# Patient Record
Sex: Male | Born: 2000 | Hispanic: No | Marital: Single | Smoking: Never smoker
Health system: Southern US, Community
[De-identification: ages and names within clinical notes are randomized; demographics above are authoritative.]

---
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
## 2020-01-08 ENCOUNTER — Other Ambulatory Visit (HOSPITAL_COMMUNITY): Payer: Self-pay | Admitting: Internal Medicine

## 2020-01-08 MED FILL — predniSONE 5 MG TABS: 5 | 90 days supply | Qty: 90 | Fill #0

## 2020-01-09 MED FILL — LISINOPRIL 10 MG TABS: 10 | 30 days supply | Qty: 30 | Fill #5

## 2020-01-09 MED FILL — SULFAMETHOXAZOLE-TMP SS TAB: 400-80 | 35 days supply | Qty: 15 | Fill #2

## 2020-03-22 MED FILL — SULFAMETHOXAZOLE-TMP SS TAB: 400-80 | 35 days supply | Qty: 15 | Fill #3

## 2020-04-16 ENCOUNTER — Other Ambulatory Visit (HOSPITAL_COMMUNITY): Payer: Self-pay | Admitting: Internal Medicine

## 2020-04-16 MED FILL — predniSONE 5 MG TABS: 5 | 90 days supply | Qty: 180 | Fill #0

## 2020-04-19 ENCOUNTER — Other Ambulatory Visit (HOSPITAL_COMMUNITY): Payer: Self-pay

## 2020-04-22 ENCOUNTER — Other Ambulatory Visit (HOSPITAL_COMMUNITY): Payer: Self-pay

## 2020-04-22 MED FILL — Lisinopril Tab 10 MG: ORAL | 30 days supply | Qty: 30 | Fill #0 | Status: AC

## 2020-05-15 ENCOUNTER — Other Ambulatory Visit (HOSPITAL_COMMUNITY): Payer: Self-pay

## 2020-05-17 ENCOUNTER — Other Ambulatory Visit (HOSPITAL_COMMUNITY): Payer: Self-pay

## 2020-05-17 MED ORDER — MYCOPHENOLATE SODIUM 360 MG PO TBEC
720.0000 mg | DELAYED_RELEASE_TABLET | Freq: Two times a day (BID) | ORAL | 5 refills | Status: AC
  Filled 2020-05-17 – 2020-05-23 (×2): qty 120, 30d supply, fill #0

## 2020-05-22 ENCOUNTER — Other Ambulatory Visit (HOSPITAL_COMMUNITY): Payer: Self-pay

## 2020-05-23 ENCOUNTER — Other Ambulatory Visit (HOSPITAL_COMMUNITY): Payer: Self-pay

## 2020-08-10 ENCOUNTER — Other Ambulatory Visit: Payer: Self-pay

## 2020-08-10 MED FILL — Lisinopril Tab 10 MG: ORAL | 30 days supply | Qty: 30 | Fill #1 | Status: AC

## 2020-08-12 ENCOUNTER — Other Ambulatory Visit (HOSPITAL_COMMUNITY): Payer: Self-pay

## 2020-08-13 ENCOUNTER — Other Ambulatory Visit (HOSPITAL_COMMUNITY): Payer: Self-pay

## 2020-08-14 ENCOUNTER — Other Ambulatory Visit (HOSPITAL_COMMUNITY): Payer: Self-pay

## 2020-08-14 ENCOUNTER — Other Ambulatory Visit: Payer: Self-pay

## 2020-08-15 ENCOUNTER — Other Ambulatory Visit (HOSPITAL_COMMUNITY): Payer: Self-pay

## 2020-10-10 ENCOUNTER — Other Ambulatory Visit: Payer: Self-pay

## 2020-10-11 ENCOUNTER — Other Ambulatory Visit (HOSPITAL_COMMUNITY): Payer: Self-pay

## 2020-10-11 MED FILL — Sulfamethoxazole-Trimethoprim Tab 400-80 MG: ORAL | 28 days supply | Qty: 12 | Fill #0 | Status: AC

## 2020-10-14 ENCOUNTER — Other Ambulatory Visit (HOSPITAL_COMMUNITY): Payer: Self-pay

## 2020-10-14 MED ORDER — SULFAMETHOXAZOLE-TRIMETHOPRIM 400-80 MG PO TABS
1.0000 | ORAL_TABLET | ORAL | 3 refills | Status: AC
  Filled 2020-10-14 – 2021-01-09 (×2): qty 15, 35d supply, fill #0

## 2020-10-23 ENCOUNTER — Other Ambulatory Visit (HOSPITAL_COMMUNITY): Payer: Self-pay

## 2020-10-23 MED ORDER — SULFAMETHOXAZOLE-TRIMETHOPRIM 400-80 MG PO TABS
1.0000 | ORAL_TABLET | ORAL | 1 refills | Status: AC
  Filled 2020-10-23: qty 12, 28d supply, fill #0

## 2020-10-23 MED ORDER — PREDNISONE 5 MG PO TABS
10.0000 mg | ORAL_TABLET | Freq: Every day | ORAL | 0 refills | Status: DC
  Filled 2020-10-23: qty 180, 90d supply, fill #0

## 2020-10-23 MED ORDER — LISINOPRIL 10 MG PO TABS
10.0000 mg | ORAL_TABLET | Freq: Every day | ORAL | 1 refills | Status: AC
  Filled 2020-10-23: qty 30, 30d supply, fill #0
  Filled 2021-01-09: qty 30, 30d supply, fill #1

## 2020-10-23 MED ORDER — VITAMIN D-1000 MAX ST 25 MCG (1000 UT) PO TABS: ORAL_TABLET | ORAL | 1 refills | Status: AC

## 2020-10-30 ENCOUNTER — Other Ambulatory Visit (HOSPITAL_COMMUNITY): Payer: Self-pay

## 2020-10-30 MED ORDER — VITAMIN D-1000 MAX ST 25 MCG (1000 UT) PO TABS: ORAL_TABLET | ORAL | 5 refills | Status: AC

## 2020-11-04 ENCOUNTER — Other Ambulatory Visit (HOSPITAL_COMMUNITY): Payer: Self-pay

## 2020-11-04 MED ORDER — TACROLIMUS 1 MG PO CAPS
8.0000 mg | ORAL_CAPSULE | Freq: Two times a day (BID) | ORAL | 3 refills | Status: AC
  Filled 2020-11-04: qty 1440, 90d supply, fill #0

## 2020-11-08 ENCOUNTER — Other Ambulatory Visit (HOSPITAL_COMMUNITY): Payer: Self-pay

## 2020-11-08 MED ORDER — FLUCONAZOLE 50 MG PO TABS
50.0000 mg | ORAL_TABLET | Freq: Every day | ORAL | 0 refills | Status: AC
  Filled 2020-11-08: qty 30, 30d supply, fill #0

## 2020-11-08 MED ORDER — VALGANCICLOVIR HCL 450 MG PO TABS
450.0000 mg | ORAL_TABLET | Freq: Every day | ORAL | 0 refills | Status: DC
  Filled 2020-11-08: qty 30, 30d supply, fill #0

## 2020-11-08 MED ORDER — PREDNISONE 5 MG PO TABS
ORAL_TABLET | ORAL | 0 refills | Status: AC
  Filled 2020-11-08 (×2): qty 70, 28d supply, fill #0

## 2020-11-11 ENCOUNTER — Other Ambulatory Visit (HOSPITAL_COMMUNITY): Payer: Self-pay

## 2021-01-09 ENCOUNTER — Other Ambulatory Visit (HOSPITAL_COMMUNITY): Payer: Self-pay

## 2021-01-17 ENCOUNTER — Other Ambulatory Visit (HOSPITAL_COMMUNITY): Payer: Self-pay

## 2021-02-04 ENCOUNTER — Other Ambulatory Visit (HOSPITAL_COMMUNITY): Payer: Self-pay

## 2021-02-04 MED ORDER — LISINOPRIL 10 MG PO TABS
10.0000 mg | ORAL_TABLET | Freq: Every day | ORAL | 1 refills | Status: DC
  Filled 2021-02-04: qty 30, 30d supply, fill #0
  Filled 2021-03-21: qty 30, 30d supply, fill #1

## 2021-02-04 MED ORDER — PREDNISONE 5 MG PO TABS
ORAL_TABLET | ORAL | 0 refills | Status: AC
  Filled 2021-02-04: qty 70, 28d supply, fill #0

## 2021-02-04 MED ORDER — TACROLIMUS 1 MG PO CAPS
7.0000 mg | ORAL_CAPSULE | Freq: Two times a day (BID) | ORAL | 3 refills | Status: AC
  Filled 2021-02-04 (×2): qty 420, 30d supply, fill #0
  Filled 2021-03-25: qty 420, 30d supply, fill #1
  Filled 2021-05-09: qty 420, 30d supply, fill #2

## 2021-02-04 MED ORDER — MYCOPHENOLATE SODIUM 360 MG PO TBEC
720.0000 mg | DELAYED_RELEASE_TABLET | Freq: Two times a day (BID) | ORAL | 5 refills | Status: AC
  Filled 2021-02-04: qty 120, 30d supply, fill #0
  Filled 2021-03-25: qty 120, 30d supply, fill #1
  Filled 2021-05-09: qty 120, 30d supply, fill #2
  Filled 2021-06-27: qty 120, 30d supply, fill #3

## 2021-02-04 MED ORDER — VITAMIN D-1000 MAX ST 25 MCG (1000 UT) PO TABS: 2000.0000 [IU] | ORAL_TABLET | Freq: Every day | ORAL | 5 refills | Status: AC

## 2021-02-05 ENCOUNTER — Other Ambulatory Visit (HOSPITAL_COMMUNITY): Payer: Self-pay

## 2021-02-07 ENCOUNTER — Other Ambulatory Visit (HOSPITAL_COMMUNITY): Payer: Self-pay

## 2021-02-07 MED ORDER — TACROLIMUS 1 MG PO CAPS: 7.0000 mg | ORAL_CAPSULE | Freq: Two times a day (BID) | ORAL | 3 refills | Status: AC

## 2021-02-12 ENCOUNTER — Other Ambulatory Visit (HOSPITAL_COMMUNITY): Payer: Self-pay

## 2021-02-12 MED ORDER — SULFAMETHOXAZOLE-TRIMETHOPRIM 400-80 MG PO TABS
1.0000 | ORAL_TABLET | ORAL | 1 refills | Status: DC
  Filled 2021-02-12: qty 15, 35d supply, fill #0
  Filled 2021-03-21: qty 15, 35d supply, fill #1

## 2021-02-13 ENCOUNTER — Other Ambulatory Visit (HOSPITAL_COMMUNITY): Payer: Self-pay

## 2021-02-13 MED ORDER — PREDNISONE 5 MG PO TABS
5.0000 mg | ORAL_TABLET | ORAL | 2 refills | Status: AC
  Filled 2021-02-13: qty 60, 30d supply, fill #0

## 2021-03-21 ENCOUNTER — Other Ambulatory Visit (HOSPITAL_COMMUNITY): Payer: Self-pay

## 2021-03-21 MED ORDER — PREDNISONE 5 MG PO TABS
5.0000 mg | ORAL_TABLET | ORAL | 3 refills | Status: DC
  Filled 2021-03-21: qty 60, 30d supply, fill #0
  Filled 2021-05-09: qty 60, 30d supply, fill #1
  Filled 2021-08-14: qty 60, 30d supply, fill #2
  Filled 2021-10-30: qty 60, 30d supply, fill #3

## 2021-03-25 ENCOUNTER — Other Ambulatory Visit (HOSPITAL_COMMUNITY): Payer: Self-pay

## 2021-04-11 ENCOUNTER — Other Ambulatory Visit (HOSPITAL_COMMUNITY): Payer: Self-pay

## 2021-04-11 MED ORDER — PREDNISONE 5 MG PO TABS
5.0000 mg | ORAL_TABLET | Freq: Every day | ORAL | 3 refills | Status: AC
  Filled 2021-04-11: qty 30, 30d supply, fill #0

## 2021-05-07 ENCOUNTER — Other Ambulatory Visit (HOSPITAL_COMMUNITY): Payer: Self-pay

## 2021-05-07 MED ORDER — TACROLIMUS 1 MG PO CAPS
ORAL_CAPSULE | ORAL | 3 refills | Status: AC
  Filled 2021-05-07: qty 390, 30d supply, fill #0

## 2021-05-09 ENCOUNTER — Other Ambulatory Visit (HOSPITAL_COMMUNITY): Payer: Self-pay

## 2021-05-09 MED ORDER — SULFAMETHOXAZOLE-TRIMETHOPRIM 400-80 MG PO TABS
1.0000 | ORAL_TABLET | ORAL | 5 refills | Status: DC
  Filled 2021-05-09: qty 12, 28d supply, fill #0
  Filled 2021-06-27: qty 12, 28d supply, fill #1
  Filled 2021-08-14: qty 12, 28d supply, fill #2
  Filled 2021-09-25: qty 12, 28d supply, fill #3
  Filled 2021-10-30: qty 12, 28d supply, fill #4
  Filled 2021-12-25: qty 12, 28d supply, fill #5

## 2021-05-09 MED ORDER — LISINOPRIL 10 MG PO TABS
10.0000 mg | ORAL_TABLET | Freq: Every day | ORAL | 1 refills | Status: DC
  Filled 2021-05-09: qty 30, 30d supply, fill #0
  Filled 2021-06-27: qty 30, 30d supply, fill #1

## 2021-06-27 ENCOUNTER — Other Ambulatory Visit (HOSPITAL_COMMUNITY): Payer: Self-pay

## 2021-07-02 ENCOUNTER — Other Ambulatory Visit (HOSPITAL_COMMUNITY): Payer: Self-pay

## 2021-07-02 MED ORDER — TACROLIMUS 1 MG PO CAPS
6.0000 mg | ORAL_CAPSULE | Freq: Two times a day (BID) | ORAL | 3 refills | Status: AC
  Filled 2021-07-02: qty 360, 30d supply, fill #0

## 2021-08-14 ENCOUNTER — Other Ambulatory Visit (HOSPITAL_COMMUNITY): Payer: Self-pay

## 2021-08-14 MED ORDER — LISINOPRIL 10 MG PO TABS
10.0000 mg | ORAL_TABLET | Freq: Every day | ORAL | 1 refills | Status: AC
  Filled 2021-08-14: qty 30, 30d supply, fill #0

## 2021-08-29 ENCOUNTER — Other Ambulatory Visit (HOSPITAL_COMMUNITY): Payer: Self-pay

## 2021-08-29 MED ORDER — MYCOPHENOLATE SODIUM 360 MG PO TBEC
720.0000 mg | DELAYED_RELEASE_TABLET | Freq: Two times a day (BID) | ORAL | 0 refills | Status: DC
  Filled 2021-08-29: qty 120, 30d supply, fill #0

## 2021-09-25 ENCOUNTER — Other Ambulatory Visit (HOSPITAL_COMMUNITY): Payer: Self-pay

## 2021-09-25 MED ORDER — LISINOPRIL 10 MG PO TABS
10.0000 mg | ORAL_TABLET | Freq: Every day | ORAL | 1 refills | Status: DC
  Filled 2021-09-25: qty 30, 30d supply, fill #0
  Filled 2021-10-30: qty 30, 30d supply, fill #1

## 2021-09-25 MED ORDER — TACROLIMUS 1 MG PO CAPS
6.0000 mg | ORAL_CAPSULE | Freq: Two times a day (BID) | ORAL | 3 refills | Status: DC
  Filled 2021-09-25: qty 360, 30d supply, fill #0
  Filled 2021-10-30: qty 360, 30d supply, fill #1
  Filled 2021-12-25: qty 360, 30d supply, fill #2
  Filled 2022-02-09: qty 360, 30d supply, fill #3

## 2021-09-26 ENCOUNTER — Other Ambulatory Visit (HOSPITAL_COMMUNITY): Payer: Self-pay

## 2021-10-30 ENCOUNTER — Other Ambulatory Visit (HOSPITAL_COMMUNITY): Payer: Self-pay

## 2021-10-31 ENCOUNTER — Other Ambulatory Visit (HOSPITAL_COMMUNITY): Payer: Self-pay

## 2021-10-31 MED ORDER — MYCOPHENOLATE SODIUM 360 MG PO TBEC
720.0000 mg | DELAYED_RELEASE_TABLET | Freq: Two times a day (BID) | ORAL | 0 refills | Status: AC
  Filled 2021-10-31: qty 120, 30d supply, fill #0

## 2021-12-18 ENCOUNTER — Other Ambulatory Visit (HOSPITAL_COMMUNITY): Payer: Self-pay

## 2021-12-18 MED ORDER — MYCOPHENOLATE SODIUM 360 MG PO TBEC
720.0000 mg | DELAYED_RELEASE_TABLET | Freq: Two times a day (BID) | ORAL | 2 refills | Status: DC
  Filled 2021-12-18: qty 120, 30d supply, fill #0
  Filled 2022-02-09: qty 120, 30d supply, fill #1
  Filled 2022-04-14 – 2022-05-13 (×2): qty 120, 30d supply, fill #2

## 2021-12-25 ENCOUNTER — Other Ambulatory Visit: Payer: Self-pay

## 2021-12-25 ENCOUNTER — Other Ambulatory Visit (HOSPITAL_COMMUNITY): Payer: Self-pay

## 2021-12-25 MED ORDER — LISINOPRIL 10 MG PO TABS
10.0000 mg | ORAL_TABLET | Freq: Every day | ORAL | 1 refills | Status: DC
  Filled 2021-12-25: qty 30, 30d supply, fill #0
  Filled 2022-02-09: qty 30, 30d supply, fill #1

## 2022-02-09 ENCOUNTER — Other Ambulatory Visit: Payer: Self-pay

## 2022-02-09 ENCOUNTER — Other Ambulatory Visit (HOSPITAL_COMMUNITY): Payer: Self-pay

## 2022-02-09 MED ORDER — PREDNISONE 5 MG PO TABS
10.0000 mg | ORAL_TABLET | Freq: Every day | ORAL | 3 refills | Status: DC
  Filled 2022-02-09: qty 60, 30d supply, fill #0
  Filled 2022-04-02: qty 60, 30d supply, fill #1
  Filled 2022-07-07: qty 60, 30d supply, fill #2
  Filled 2022-09-16: qty 60, 30d supply, fill #3

## 2022-02-09 MED ORDER — SULFAMETHOXAZOLE-TRIMETHOPRIM 400-80 MG PO TABS
1.0000 | ORAL_TABLET | ORAL | 5 refills | Status: AC
  Filled 2022-02-09: qty 12, 28d supply, fill #0
  Filled 2022-04-02: qty 12, 28d supply, fill #1
  Filled 2022-05-13: qty 12, 28d supply, fill #2
  Filled 2022-07-07: qty 12, 28d supply, fill #3
  Filled 2022-09-16: qty 12, 28d supply, fill #4
  Filled 2022-11-23: qty 12, 28d supply, fill #5

## 2022-02-10 ENCOUNTER — Other Ambulatory Visit (HOSPITAL_COMMUNITY): Payer: Self-pay

## 2022-03-18 DIAGNOSIS — D631 Anemia in chronic kidney disease: Secondary | ICD-10-CM | POA: Diagnosis not present

## 2022-03-18 DIAGNOSIS — Z4822 Encounter for aftercare following kidney transplant: Secondary | ICD-10-CM | POA: Diagnosis not present

## 2022-03-18 DIAGNOSIS — E785 Hyperlipidemia, unspecified: Secondary | ICD-10-CM | POA: Diagnosis not present

## 2022-03-18 DIAGNOSIS — I129 Hypertensive chronic kidney disease with stage 1 through stage 4 chronic kidney disease, or unspecified chronic kidney disease: Secondary | ICD-10-CM | POA: Diagnosis not present

## 2022-03-18 DIAGNOSIS — N189 Chronic kidney disease, unspecified: Secondary | ICD-10-CM | POA: Diagnosis not present

## 2022-03-18 DIAGNOSIS — B2700 Gammaherpesviral mononucleosis without complication: Secondary | ICD-10-CM | POA: Diagnosis not present

## 2022-03-18 DIAGNOSIS — T8611 Kidney transplant rejection: Secondary | ICD-10-CM | POA: Diagnosis not present

## 2022-03-18 DIAGNOSIS — Z7969 Long term (current) use of other immunomodulators and immunosuppressants: Secondary | ICD-10-CM | POA: Diagnosis not present

## 2022-03-18 DIAGNOSIS — Z7952 Long term (current) use of systemic steroids: Secondary | ICD-10-CM | POA: Diagnosis not present

## 2022-04-02 ENCOUNTER — Other Ambulatory Visit (HOSPITAL_COMMUNITY): Payer: Self-pay

## 2022-04-02 MED ORDER — LISINOPRIL 10 MG PO TABS
10.0000 mg | ORAL_TABLET | Freq: Every day | ORAL | 1 refills | Status: DC
  Filled 2022-04-02: qty 30, 30d supply, fill #0
  Filled 2022-05-13: qty 30, 30d supply, fill #1

## 2022-04-02 MED ORDER — TACROLIMUS 1 MG PO CAPS
6.0000 mg | ORAL_CAPSULE | Freq: Two times a day (BID) | ORAL | 3 refills | Status: DC
  Filled 2022-04-02: qty 360, 30d supply, fill #0
  Filled 2022-05-13 – 2022-05-26 (×2): qty 360, 30d supply, fill #1
  Filled 2022-07-24 – 2022-08-11 (×2): qty 360, 30d supply, fill #2
  Filled 2022-10-21: qty 360, 30d supply, fill #3

## 2022-04-06 ENCOUNTER — Other Ambulatory Visit (HOSPITAL_COMMUNITY): Payer: Self-pay

## 2022-04-15 ENCOUNTER — Other Ambulatory Visit (HOSPITAL_COMMUNITY): Payer: Self-pay

## 2022-05-01 ENCOUNTER — Other Ambulatory Visit (HOSPITAL_COMMUNITY): Payer: Self-pay

## 2022-05-13 ENCOUNTER — Other Ambulatory Visit: Payer: Self-pay

## 2022-05-13 ENCOUNTER — Other Ambulatory Visit (HOSPITAL_COMMUNITY): Payer: Self-pay

## 2022-05-13 DIAGNOSIS — Z940 Kidney transplant status: Secondary | ICD-10-CM | POA: Diagnosis not present

## 2022-05-13 DIAGNOSIS — Z79899 Other long term (current) drug therapy: Secondary | ICD-10-CM | POA: Diagnosis not present

## 2022-05-13 DIAGNOSIS — Z5181 Encounter for therapeutic drug level monitoring: Secondary | ICD-10-CM | POA: Diagnosis not present

## 2022-05-13 DIAGNOSIS — Z79621 Long term (current) use of calcineurin inhibitor: Secondary | ICD-10-CM | POA: Diagnosis not present

## 2022-05-13 DIAGNOSIS — I151 Hypertension secondary to other renal disorders: Secondary | ICD-10-CM | POA: Diagnosis not present

## 2022-05-13 DIAGNOSIS — Z4822 Encounter for aftercare following kidney transplant: Secondary | ICD-10-CM | POA: Diagnosis not present

## 2022-05-25 ENCOUNTER — Other Ambulatory Visit (HOSPITAL_COMMUNITY): Payer: Self-pay

## 2022-05-26 ENCOUNTER — Other Ambulatory Visit (HOSPITAL_COMMUNITY): Payer: Self-pay

## 2022-07-07 ENCOUNTER — Other Ambulatory Visit: Payer: Self-pay

## 2022-07-07 ENCOUNTER — Other Ambulatory Visit (HOSPITAL_COMMUNITY): Payer: Self-pay

## 2022-07-07 MED ORDER — LISINOPRIL 10 MG PO TABS
10.0000 mg | ORAL_TABLET | Freq: Every day | ORAL | 3 refills | Status: DC
  Filled 2022-07-07: qty 90, 90d supply, fill #0
  Filled 2022-11-23: qty 90, 90d supply, fill #1
  Filled 2023-03-25: qty 30, 30d supply, fill #2
  Filled 2023-04-29: qty 30, 30d supply, fill #3
  Filled 2023-06-15: qty 30, 30d supply, fill #4

## 2022-07-07 MED ORDER — MYCOPHENOLATE SODIUM 360 MG PO TBEC
720.0000 mg | DELAYED_RELEASE_TABLET | Freq: Two times a day (BID) | ORAL | 2 refills | Status: DC
  Filled 2022-07-07: qty 120, 30d supply, fill #0
  Filled 2022-09-04 – 2022-09-16 (×2): qty 120, 30d supply, fill #1
  Filled 2022-11-23: qty 120, 30d supply, fill #2

## 2022-07-08 DIAGNOSIS — Z940 Kidney transplant status: Secondary | ICD-10-CM | POA: Diagnosis not present

## 2022-08-03 ENCOUNTER — Other Ambulatory Visit (HOSPITAL_COMMUNITY): Payer: Self-pay

## 2022-08-05 DIAGNOSIS — Z940 Kidney transplant status: Secondary | ICD-10-CM | POA: Diagnosis not present

## 2022-08-11 ENCOUNTER — Other Ambulatory Visit (HOSPITAL_COMMUNITY): Payer: Self-pay

## 2022-09-14 ENCOUNTER — Other Ambulatory Visit (HOSPITAL_COMMUNITY): Payer: Self-pay

## 2022-09-24 DIAGNOSIS — Z940 Kidney transplant status: Secondary | ICD-10-CM | POA: Diagnosis not present

## 2022-11-13 DIAGNOSIS — Z940 Kidney transplant status: Secondary | ICD-10-CM | POA: Diagnosis not present

## 2022-11-25 ENCOUNTER — Other Ambulatory Visit (HOSPITAL_COMMUNITY): Payer: Self-pay

## 2022-12-10 DIAGNOSIS — Z949 Transplanted organ and tissue status, unspecified: Secondary | ICD-10-CM | POA: Diagnosis not present

## 2022-12-10 DIAGNOSIS — Z5181 Encounter for therapeutic drug level monitoring: Secondary | ICD-10-CM | POA: Diagnosis not present

## 2022-12-10 DIAGNOSIS — Z7969 Long term (current) use of other immunomodulators and immunosuppressants: Secondary | ICD-10-CM | POA: Diagnosis not present

## 2022-12-10 DIAGNOSIS — Z4822 Encounter for aftercare following kidney transplant: Secondary | ICD-10-CM | POA: Diagnosis not present

## 2022-12-10 DIAGNOSIS — Z940 Kidney transplant status: Secondary | ICD-10-CM | POA: Diagnosis not present

## 2022-12-10 DIAGNOSIS — Z79621 Long term (current) use of calcineurin inhibitor: Secondary | ICD-10-CM | POA: Diagnosis not present

## 2022-12-10 DIAGNOSIS — Z79899 Other long term (current) drug therapy: Secondary | ICD-10-CM | POA: Diagnosis not present

## 2022-12-29 ENCOUNTER — Other Ambulatory Visit (HOSPITAL_COMMUNITY): Payer: Self-pay

## 2022-12-29 MED ORDER — TACROLIMUS 1 MG PO CAPS
4.0000 mg | ORAL_CAPSULE | Freq: Two times a day (BID) | ORAL | 2 refills | Status: DC
  Filled 2022-12-29: qty 240, 30d supply, fill #0
  Filled 2023-02-09: qty 240, 30d supply, fill #1
  Filled 2023-03-25: qty 240, 30d supply, fill #2

## 2022-12-29 MED ORDER — MYCOPHENOLATE SODIUM 360 MG PO TBEC
720.0000 mg | DELAYED_RELEASE_TABLET | Freq: Two times a day (BID) | ORAL | 2 refills | Status: DC
  Filled 2022-12-29: qty 120, 30d supply, fill #0
  Filled 2023-03-05: qty 120, 30d supply, fill #1
  Filled 2023-04-29: qty 120, 30d supply, fill #2

## 2023-02-01 ENCOUNTER — Other Ambulatory Visit (HOSPITAL_COMMUNITY): Payer: Self-pay

## 2023-02-02 ENCOUNTER — Other Ambulatory Visit (HOSPITAL_COMMUNITY): Payer: Self-pay

## 2023-02-02 MED ORDER — PREDNISONE 5 MG PO TABS
10.0000 mg | ORAL_TABLET | Freq: Every day | ORAL | 3 refills | Status: DC
  Filled 2023-02-02: qty 60, 30d supply, fill #0
  Filled 2023-04-29: qty 60, 30d supply, fill #1
  Filled 2023-07-22: qty 60, 30d supply, fill #2
  Filled 2023-10-06: qty 60, 30d supply, fill #3

## 2023-02-09 ENCOUNTER — Other Ambulatory Visit (HOSPITAL_COMMUNITY): Payer: Self-pay

## 2023-02-10 DIAGNOSIS — Z940 Kidney transplant status: Secondary | ICD-10-CM | POA: Diagnosis not present

## 2023-02-10 DIAGNOSIS — Z5181 Encounter for therapeutic drug level monitoring: Secondary | ICD-10-CM | POA: Diagnosis not present

## 2023-02-10 DIAGNOSIS — Z79899 Other long term (current) drug therapy: Secondary | ICD-10-CM | POA: Diagnosis not present

## 2023-02-10 DIAGNOSIS — D84821 Immunodeficiency due to drugs: Secondary | ICD-10-CM | POA: Diagnosis not present

## 2023-02-10 DIAGNOSIS — Z949 Transplanted organ and tissue status, unspecified: Secondary | ICD-10-CM | POA: Diagnosis not present

## 2023-02-10 DIAGNOSIS — Z4822 Encounter for aftercare following kidney transplant: Secondary | ICD-10-CM | POA: Diagnosis not present

## 2023-02-10 DIAGNOSIS — Z7969 Long term (current) use of other immunomodulators and immunosuppressants: Secondary | ICD-10-CM | POA: Diagnosis not present

## 2023-02-10 DIAGNOSIS — Z79621 Long term (current) use of calcineurin inhibitor: Secondary | ICD-10-CM | POA: Diagnosis not present

## 2023-03-25 ENCOUNTER — Other Ambulatory Visit (HOSPITAL_COMMUNITY): Payer: Self-pay

## 2023-04-29 ENCOUNTER — Other Ambulatory Visit: Payer: Self-pay

## 2023-04-29 ENCOUNTER — Other Ambulatory Visit (HOSPITAL_COMMUNITY): Payer: Self-pay

## 2023-04-29 MED ORDER — TACROLIMUS 1 MG PO CAPS
4.0000 mg | ORAL_CAPSULE | Freq: Two times a day (BID) | ORAL | 2 refills | Status: AC
  Filled 2023-04-29: qty 240, 30d supply, fill #0
  Filled 2023-06-15: qty 240, 30d supply, fill #1
  Filled 2023-08-04: qty 240, 30d supply, fill #2

## 2023-04-30 ENCOUNTER — Other Ambulatory Visit (HOSPITAL_COMMUNITY): Payer: Self-pay

## 2023-05-18 ENCOUNTER — Other Ambulatory Visit (HOSPITAL_COMMUNITY): Payer: Self-pay

## 2023-05-18 MED ORDER — PREDNISONE 5 MG PO TABS
5.0000 mg | ORAL_TABLET | Freq: Every day | ORAL | 3 refills | Status: AC
  Filled 2023-05-18: qty 30, 30d supply, fill #0

## 2023-05-19 ENCOUNTER — Other Ambulatory Visit (HOSPITAL_COMMUNITY): Payer: Self-pay

## 2023-05-19 ENCOUNTER — Other Ambulatory Visit: Payer: Self-pay

## 2023-05-19 MED ORDER — TACROLIMUS 1 MG PO CAPS
2.0000 mg | ORAL_CAPSULE | Freq: Two times a day (BID) | ORAL | 2 refills | Status: AC
Start: 2023-05-19 — End: ?
  Filled 2023-05-19: qty 120, 30d supply, fill #0

## 2023-05-31 ENCOUNTER — Other Ambulatory Visit (HOSPITAL_COMMUNITY): Payer: Self-pay

## 2023-06-15 ENCOUNTER — Other Ambulatory Visit (HOSPITAL_COMMUNITY): Payer: Self-pay

## 2023-06-15 MED ORDER — MYCOPHENOLATE SODIUM 360 MG PO TBEC
720.0000 mg | DELAYED_RELEASE_TABLET | Freq: Two times a day (BID) | ORAL | 2 refills | Status: DC
  Filled 2023-06-15: qty 120, 30d supply, fill #0
  Filled 2023-08-04: qty 120, 30d supply, fill #1
  Filled 2023-09-22: qty 120, 30d supply, fill #2

## 2023-06-16 ENCOUNTER — Other Ambulatory Visit (HOSPITAL_COMMUNITY): Payer: Self-pay

## 2023-07-22 ENCOUNTER — Other Ambulatory Visit (HOSPITAL_COMMUNITY): Payer: Self-pay

## 2023-07-22 MED ORDER — LISINOPRIL 10 MG PO TABS
10.0000 mg | ORAL_TABLET | Freq: Every day | ORAL | 3 refills | Status: AC
  Filled 2023-07-22: qty 30, 30d supply, fill #0
  Filled 2023-09-01 (×2): qty 30, 30d supply, fill #1
  Filled 2023-10-06: qty 30, 30d supply, fill #2
  Filled 2023-11-23: qty 30, 30d supply, fill #3
  Filled 2023-12-30: qty 30, 30d supply, fill #4
  Filled 2024-01-28: qty 30, 30d supply, fill #5

## 2023-09-22 ENCOUNTER — Other Ambulatory Visit (HOSPITAL_COMMUNITY): Payer: Self-pay

## 2023-09-22 MED ORDER — TACROLIMUS 1 MG PO CAPS
2.0000 mg | ORAL_CAPSULE | Freq: Two times a day (BID) | ORAL | 2 refills | Status: DC
  Filled 2023-09-22: qty 120, 30d supply, fill #0
  Filled 2023-10-12 – 2023-10-15 (×2): qty 120, 30d supply, fill #1
  Filled 2023-11-23: qty 120, 30d supply, fill #2

## 2023-09-23 ENCOUNTER — Other Ambulatory Visit (HOSPITAL_COMMUNITY): Payer: Self-pay

## 2023-10-12 ENCOUNTER — Other Ambulatory Visit: Payer: Self-pay

## 2023-10-12 ENCOUNTER — Other Ambulatory Visit (HOSPITAL_COMMUNITY): Payer: Self-pay

## 2023-10-15 ENCOUNTER — Other Ambulatory Visit: Payer: Self-pay

## 2023-10-15 ENCOUNTER — Other Ambulatory Visit (HOSPITAL_COMMUNITY): Payer: Self-pay

## 2023-10-26 ENCOUNTER — Other Ambulatory Visit (HOSPITAL_COMMUNITY): Payer: Self-pay

## 2023-10-26 DIAGNOSIS — D849 Immunodeficiency, unspecified: Secondary | ICD-10-CM | POA: Diagnosis not present

## 2023-10-26 DIAGNOSIS — Z5181 Encounter for therapeutic drug level monitoring: Secondary | ICD-10-CM | POA: Diagnosis not present

## 2023-10-26 DIAGNOSIS — I10 Essential (primary) hypertension: Secondary | ICD-10-CM | POA: Diagnosis not present

## 2023-10-26 DIAGNOSIS — Z79621 Long term (current) use of calcineurin inhibitor: Secondary | ICD-10-CM | POA: Diagnosis not present

## 2023-10-26 DIAGNOSIS — Z4822 Encounter for aftercare following kidney transplant: Secondary | ICD-10-CM | POA: Diagnosis not present

## 2023-10-26 DIAGNOSIS — Z940 Kidney transplant status: Secondary | ICD-10-CM | POA: Diagnosis not present

## 2023-10-26 MED ORDER — MYCOPHENOLATE SODIUM 360 MG PO TBEC
720.0000 mg | DELAYED_RELEASE_TABLET | Freq: Two times a day (BID) | ORAL | 6 refills | Status: AC
  Filled 2023-10-26 – 2023-11-09 (×2): qty 60, 30d supply, fill #0
  Filled 2023-11-09: qty 120, 30d supply, fill #0
  Filled 2023-12-23: qty 120, 30d supply, fill #1
  Filled 2024-01-28: qty 120, 30d supply, fill #2

## 2023-11-04 ENCOUNTER — Other Ambulatory Visit (HOSPITAL_COMMUNITY): Payer: Self-pay

## 2023-11-09 ENCOUNTER — Other Ambulatory Visit (HOSPITAL_COMMUNITY): Payer: Self-pay

## 2023-11-10 DIAGNOSIS — Z940 Kidney transplant status: Secondary | ICD-10-CM | POA: Diagnosis not present

## 2023-12-23 ENCOUNTER — Other Ambulatory Visit (HOSPITAL_COMMUNITY): Payer: Self-pay

## 2023-12-23 MED ORDER — PREDNISONE 5 MG PO TABS
10.0000 mg | ORAL_TABLET | Freq: Every day | ORAL | 3 refills | Status: AC
  Filled 2023-12-23: qty 60, 30d supply, fill #0

## 2023-12-30 ENCOUNTER — Other Ambulatory Visit (HOSPITAL_COMMUNITY): Payer: Self-pay

## 2023-12-31 ENCOUNTER — Other Ambulatory Visit (HOSPITAL_COMMUNITY): Payer: Self-pay

## 2023-12-31 ENCOUNTER — Other Ambulatory Visit: Payer: Self-pay

## 2023-12-31 MED ORDER — TACROLIMUS 1 MG PO CAPS
2.0000 mg | ORAL_CAPSULE | Freq: Two times a day (BID) | ORAL | 2 refills | Status: AC
  Filled 2023-12-31: qty 120, 30d supply, fill #0
  Filled 2024-01-28: qty 120, 30d supply, fill #1

## 2024-01-28 ENCOUNTER — Other Ambulatory Visit (HOSPITAL_COMMUNITY): Payer: Self-pay
